# Patient Record
Sex: Female | Born: 1945 | Race: White | Hispanic: No | Smoking: Former smoker
Health system: Southern US, Community
[De-identification: ages and names within clinical notes are randomized; demographics above are authoritative.]

## PROBLEM LIST (undated history)

## (undated) DIAGNOSIS — E119 Type 2 diabetes mellitus without complications: Secondary | ICD-10-CM

## (undated) DIAGNOSIS — I10 Essential (primary) hypertension: Secondary | ICD-10-CM

## (undated) DIAGNOSIS — IMO0002 Reserved for concepts with insufficient information to code with codable children: Secondary | ICD-10-CM

## (undated) DIAGNOSIS — I509 Heart failure, unspecified: Secondary | ICD-10-CM

## (undated) DIAGNOSIS — M797 Fibromyalgia: Secondary | ICD-10-CM

## (undated) DIAGNOSIS — M329 Systemic lupus erythematosus, unspecified: Secondary | ICD-10-CM

## (undated) HISTORY — PX: HERNIA REPAIR: SHX51

## (undated) HISTORY — PX: CHOLECYSTECTOMY: SHX55

## (undated) HISTORY — PX: ABDOMINAL HYSTERECTOMY: SHX81

---

## 2006-11-01 ENCOUNTER — Emergency Department (HOSPITAL_COMMUNITY): Admission: EM | Admit: 2006-11-01 | Discharge: 2006-11-01 | Payer: Self-pay | Admitting: Emergency Medicine

## 2006-11-02 ENCOUNTER — Ambulatory Visit: Payer: Self-pay | Admitting: Vascular Surgery

## 2006-11-02 ENCOUNTER — Encounter (INDEPENDENT_AMBULATORY_CARE_PROVIDER_SITE_OTHER): Payer: Self-pay | Admitting: Sports Medicine

## 2006-11-02 ENCOUNTER — Ambulatory Visit: Admission: RE | Admit: 2006-11-02 | Discharge: 2006-11-02 | Payer: Self-pay | Admitting: Sports Medicine

## 2008-12-29 ENCOUNTER — Encounter: Admission: RE | Admit: 2008-12-29 | Discharge: 2008-12-29 | Payer: Self-pay | Admitting: Family Medicine

## 2009-08-03 ENCOUNTER — Encounter: Admission: RE | Admit: 2009-08-03 | Discharge: 2009-08-03 | Payer: Self-pay | Admitting: Family Medicine

## 2011-03-27 IMAGING — CT CT PELVIS W/ CM
3 of 5 series · 12 of 36 positions shown, 18 images · IV contrast (READICAT/WATER & [ID] OMNI 300)
Comparison: None.

CT ABDOMEN

CLINICAL DATA: Right upper quadrant pain.  Left adrenal lesion.
History of prior abdominal hernia repair, appendectomy, and total
abdominal hysterectomy.

CT ABDOMEN AND PELVIS WITH CONTRAST
TECHNIQUE: Multidetector CT imaging of the abdomen and pelvis was
performed using the standard protocol following bolus
administration of intravenous contrast.
Contrast: 125 ml [id number]

[Series 3: routine abdomen · axial · 0.76mm/px · z∈[-393,-8]mm · 8 of 99 slices shown, 13 images]
[im 11/99  soft-tissue]
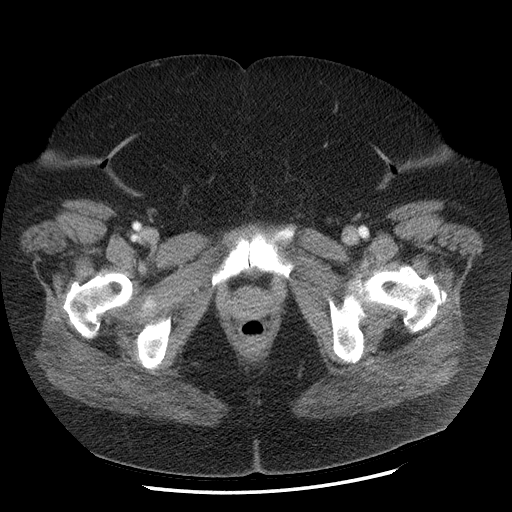
[im 11/99  bone]
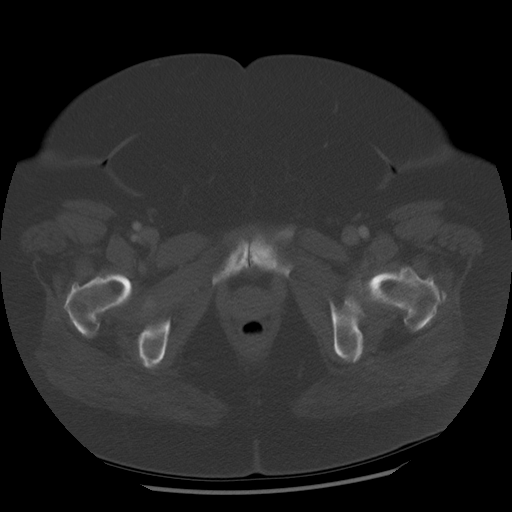
[im 22/99  soft-tissue]
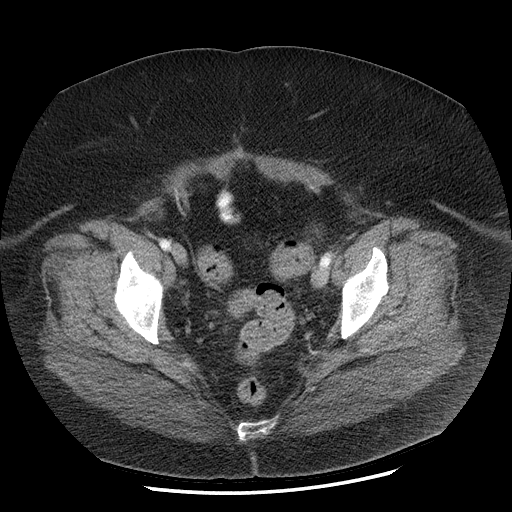
[im 33/99  soft-tissue]
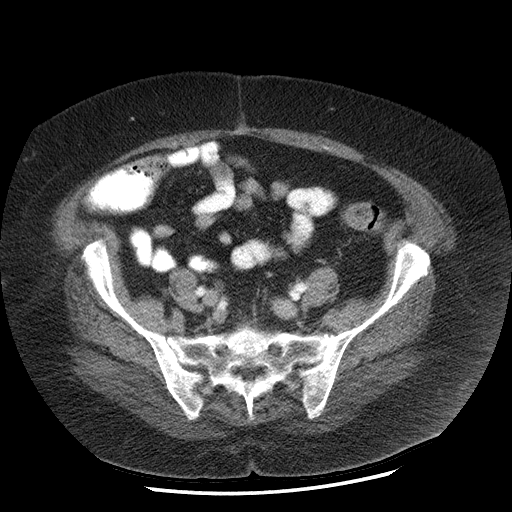
[im 44/99  soft-tissue]
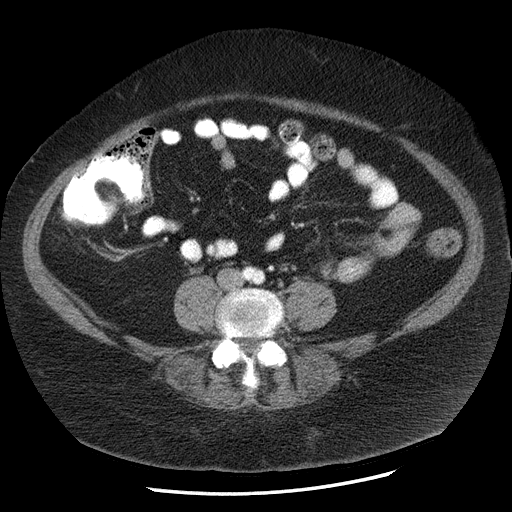
[im 55/99  soft-tissue]
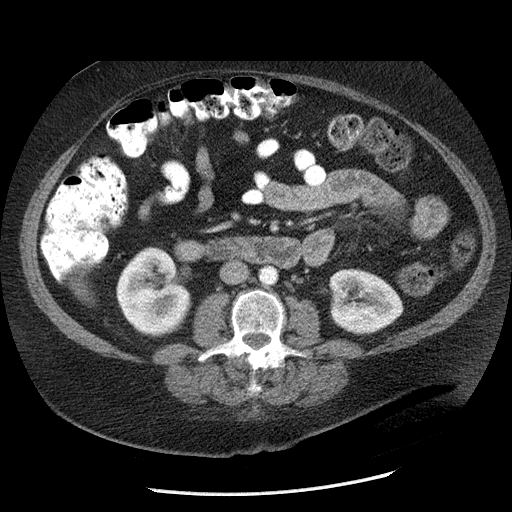
[im 55/99  lung]
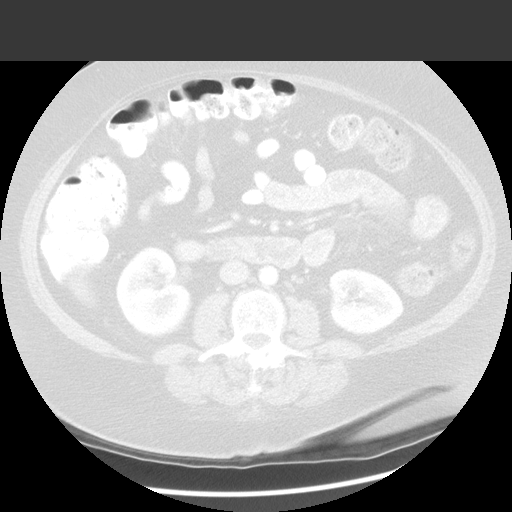
[im 66/99  soft-tissue]
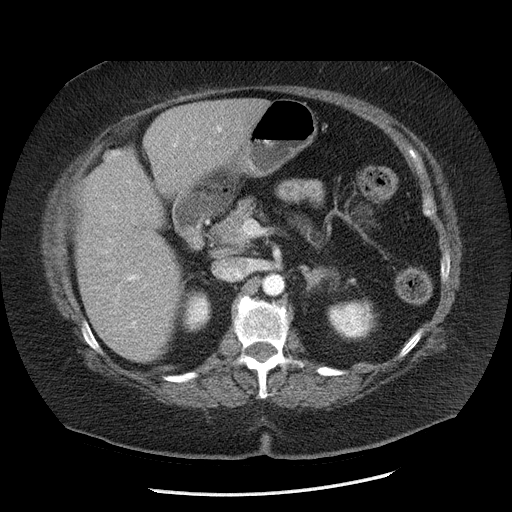
[im 66/99  lung]
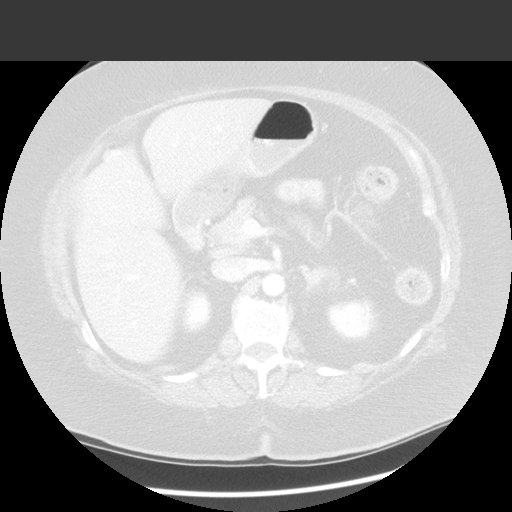
[im 77/99  soft-tissue]
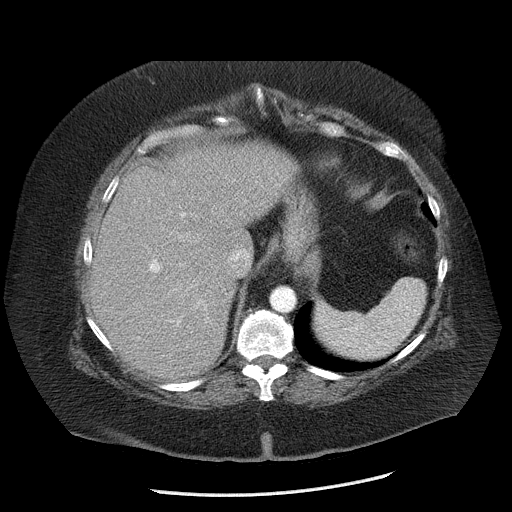
[im 77/99  lung]
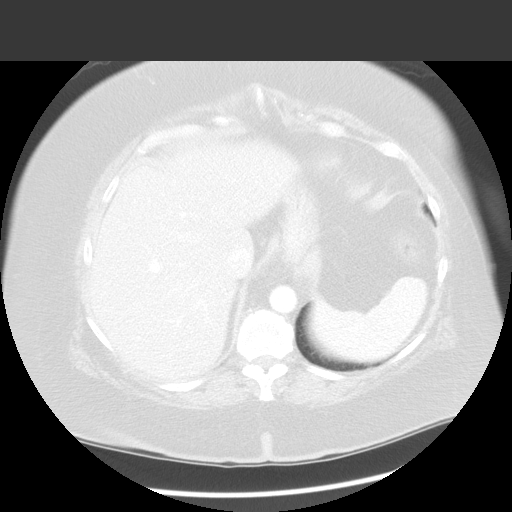
[im 88/99  soft-tissue]
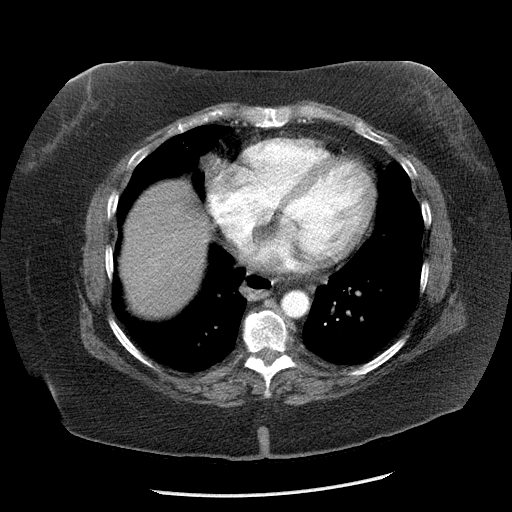
[im 88/99  lung]
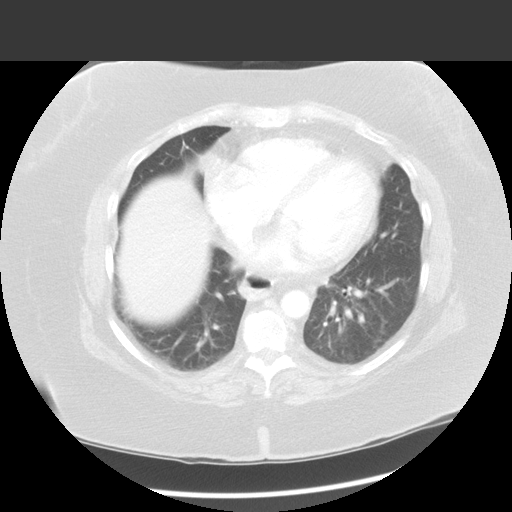

[Series 601: coronal body · coronal · 0.99mm/px · 1 of 144 slices shown, 2 images]
[im 48/144  soft-tissue]
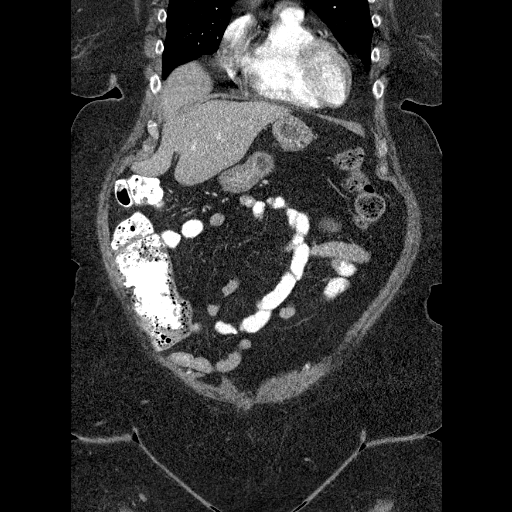
[im 48/144  bone]
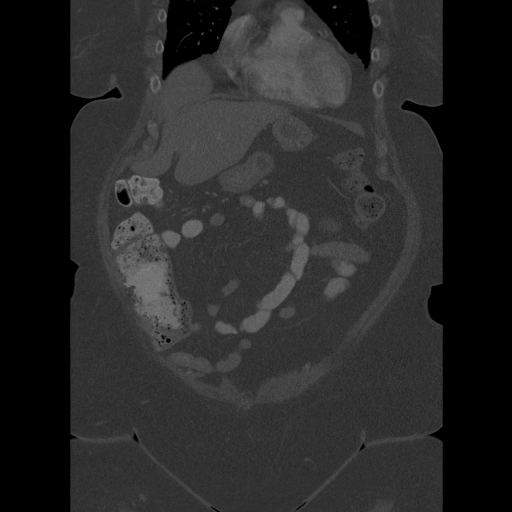

[Series 602: sagittal body · sagittal · 0.99mm/px · 3 of 158 slices shown]
[im 11/158  soft-tissue]
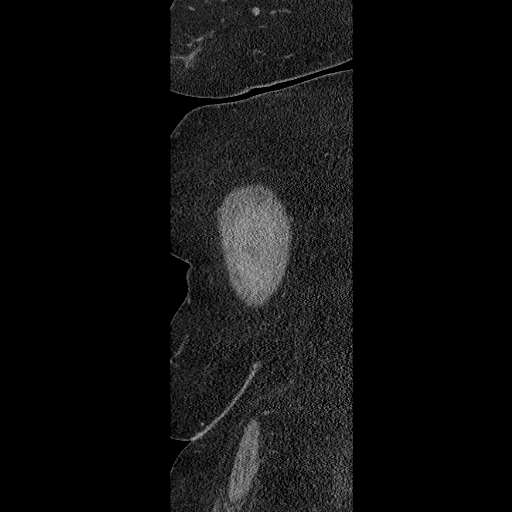
[im 32/158  soft-tissue]
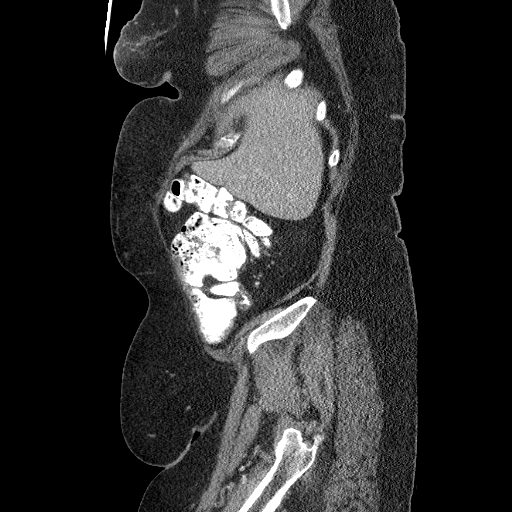
[im 53/158  soft-tissue]
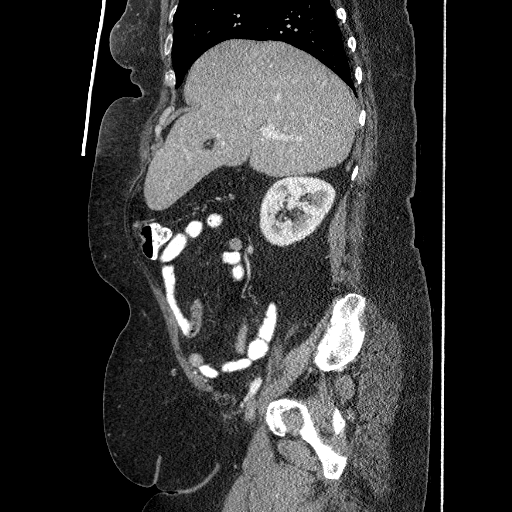

[12 of 36 positions shown; findings below may reference images not displayed]

FINDINGS: The liver, spleen, duodenum, pancreas, and kidneys have
normal imaging features.  Moderate hiatal hernia is evident, but
stomach is otherwise unremarkable.  The gallbladder is surgically
absent.

There is no adrenal mass on either side.  Some portions of the left
adrenal parenchyma have an axial orientation which could mimic a
mass on the axial sequences, but sagittal and coronal reformations
confirm that there is no mass lesion within the left adrenal gland.

There is no abdominal lymphadenopathy.  No abdominal aortic
aneurysm.  The abdominal bowel loops have normal imaging features.
There is no evidence for recurrent anterior abdominal wall hernia.
IMPRESSION: No evidence for left adrenal mass.

No evidence for recurrent anterior abdominal wall hernia.

No acute findings in the abdomen.

Moderate hiatal hernia.

CT PELVIS
FINDINGS: No intraperitoneal free fluid.  No pelvic
lymphadenopathy.  Small lymph nodes are seen in both pelvic
sidewalls, but do not meet CT criteria for pathologic enlargement.
The bladder is unremarkable.  No substantial diverticular change in
the colon and there is no evidence for diverticulitis.

The terminal ileum is normal.  Nonvisualization of the appendix is
consistent with the reported history of prior appendectomy.  The
uterus is surgically absent.  There is no evidence for an adnexal
mass.

Bone windows show sclerotic changes at the symphysis pubis.  No
worrisome lytic or sclerotic osseous lesion is evident.
IMPRESSION: No acute findings in the pelvis.

## 2016-09-24 ENCOUNTER — Emergency Department (HOSPITAL_COMMUNITY): Payer: Medicare PPO

## 2016-09-24 ENCOUNTER — Encounter (HOSPITAL_COMMUNITY): Payer: Self-pay

## 2016-09-24 ENCOUNTER — Emergency Department (HOSPITAL_COMMUNITY)
Admission: EM | Admit: 2016-09-24 | Discharge: 2016-09-25 | Disposition: A | Discharge status: Home / Self Care | Payer: Medicare PPO | Attending: Emergency Medicine | Admitting: Emergency Medicine

## 2016-09-24 DIAGNOSIS — Y939 Activity, unspecified: Secondary | ICD-10-CM | POA: Insufficient documentation

## 2016-09-24 DIAGNOSIS — I110 Hypertensive heart disease with heart failure: Secondary | ICD-10-CM | POA: Insufficient documentation

## 2016-09-24 DIAGNOSIS — W228XXA Striking against or struck by other objects, initial encounter: Secondary | ICD-10-CM | POA: Diagnosis not present

## 2016-09-24 DIAGNOSIS — S0990XA Unspecified injury of head, initial encounter: Secondary | ICD-10-CM | POA: Diagnosis present

## 2016-09-24 DIAGNOSIS — Y92009 Unspecified place in unspecified non-institutional (private) residence as the place of occurrence of the external cause: Secondary | ICD-10-CM | POA: Insufficient documentation

## 2016-09-24 DIAGNOSIS — I509 Heart failure, unspecified: Secondary | ICD-10-CM | POA: Insufficient documentation

## 2016-09-24 DIAGNOSIS — S0101XA Laceration without foreign body of scalp, initial encounter: Secondary | ICD-10-CM | POA: Diagnosis not present

## 2016-09-24 DIAGNOSIS — E119 Type 2 diabetes mellitus without complications: Secondary | ICD-10-CM | POA: Diagnosis not present

## 2016-09-24 DIAGNOSIS — Z87891 Personal history of nicotine dependence: Secondary | ICD-10-CM | POA: Diagnosis not present

## 2016-09-24 DIAGNOSIS — M62838 Other muscle spasm: Secondary | ICD-10-CM | POA: Diagnosis not present

## 2016-09-24 DIAGNOSIS — Y999 Unspecified external cause status: Secondary | ICD-10-CM | POA: Diagnosis not present

## 2016-09-24 DIAGNOSIS — W19XXXA Unspecified fall, initial encounter: Secondary | ICD-10-CM

## 2016-09-24 HISTORY — DX: Fibromyalgia: M79.7

## 2016-09-24 HISTORY — DX: Essential (primary) hypertension: I10

## 2016-09-24 HISTORY — DX: Type 2 diabetes mellitus without complications: E11.9

## 2016-09-24 HISTORY — DX: Reserved for concepts with insufficient information to code with codable children: IMO0002

## 2016-09-24 HISTORY — DX: Heart failure, unspecified: I50.9

## 2016-09-24 HISTORY — DX: Systemic lupus erythematosus, unspecified: M32.9

## 2016-09-24 MED ORDER — ORPHENADRINE CITRATE 30 MG/ML IJ SOLN: 30.0000 mg | Freq: Once | INTRAMUSCULAR | Status: DC

## 2016-09-24 MED ORDER — FENTANYL CITRATE (PF) 100 MCG/2ML IJ SOLN
50.0000 ug | INTRAMUSCULAR | Status: DC | PRN
  Administered 2016-09-24 (×2): 50 ug via INTRAVENOUS
  Filled 2016-09-24 (×2): qty 2

## 2016-09-24 NOTE — ED Provider Notes (Signed)
[facility]-EMERGENCY DEPT Provider Note   CSN: [id number] Arrival date & time: 09/24/16  2116     History   Chief Complaint Chief Complaint  Patient presents with  . Fall  . Head Injury  . Head Laceration  . Back Pain    HPI Charlene Carter is a 71 y.o. female.  Patient reports having a mechanical fall. Attempted walking indoors. Fell backwards. Struck her head on the ground. No loss of consciousness. No blood thinners. Reporting pain from her head all the way down her spine. No other complaints.   The history is provided by the patient.  Illness  This is a new problem. The current episode started less than 1 hour ago. The problem occurs constantly. The problem has not changed since onset.Associated symptoms include headaches. Pertinent negatives include no chest pain, no abdominal pain and no shortness of breath. She has tried nothing for the symptoms.    Past Medical History:  Diagnosis Date  . CHF (congestive heart failure) (HCC)   . Diabetes mellitus without complication (HCC)   . Fibromyalgia   . Hypertension   . Lupus     There are no active problems to display for this patient.   Past Surgical History:  Procedure Laterality Date  . ABDOMINAL HYSTERECTOMY    . CHOLECYSTECTOMY    . HERNIA REPAIR      OB History    No data available       Home Medications    Prior to Admission medications   Medication Sig Start Date End Date Taking? Authorizing Provider  cyclobenzaprine (FLEXERIL) 10 MG tablet Take 1 tablet (10 mg total) by mouth 3 (three) times daily as needed for muscle spasms. 09/25/16   Lindalou Hose, MD    Family History History reviewed. No pertinent family history.  Social History Social History  Substance Use Topics  . Smoking status: Former Games developer  . Smokeless tobacco: Never Used  . Alcohol use No     Allergies   Lipitor [atorvastatin]; Lyrica [pregabalin]; and Vioxx [rofecoxib]   Review of Systems Review of Systems  Eyes: Negative for  visual disturbance.  Respiratory: Negative for shortness of breath.   Cardiovascular: Negative for chest pain.  Gastrointestinal: Negative for abdominal pain, nausea and vomiting.  Musculoskeletal: Positive for back pain and neck pain.  Skin: Positive for wound.  Neurological: Positive for headaches.       No LOC     Physical Exam Updated Vital Signs BP (!) 155/75   Pulse 84   Temp 98.1 F (36.7 C) (Oral)   Resp 20   SpO2 99%   Physical Exam  Constitutional: She appears well-developed and well-nourished. No distress.  HENT:  Head: Normocephalic and atraumatic.    Eyes: Conjunctivae are normal.  Neck: Neck supple.  Cardiovascular: Normal rate and regular rhythm.   No murmur heard. Pulmonary/Chest: Effort normal and breath sounds normal. No respiratory distress.  No tenderness to palpation of the chest wall with AP or lateral compression. No crepitus.  Abdominal: Soft. There is no tenderness.  No tenderness to palpation of abdomen. No peritoneal signs.  Musculoskeletal: She exhibits no edema.  Tenderness to palpation along the entire midline spine. No step-offs.  Neurological: She is alert.  Skin: Skin is warm and dry.  Psychiatric: She has a normal mood and affect.  Nursing note and vitals reviewed.    ED Treatments / Results  Labs (all labs ordered are listed, but only abnormal results are displayed) Labs Reviewed - No  data to display  EKG  EKG Interpretation None       Radiology Ct Head Wo Contrast  Result Date: 09/24/2016 CLINICAL DATA:  Patient fell striking the back of the head. Laceration to the back of the head. No loss of consciousness. EXAM: CT HEAD WITHOUT CONTRAST CT CERVICAL SPINE WITHOUT CONTRAST TECHNIQUE: Multidetector CT imaging of the head and cervical spine was performed following the standard protocol without intravenous contrast. Multiplanar CT image reconstructions of the cervical spine were also generated. COMPARISON:  None. FINDINGS: CT  HEAD FINDINGS Brain: No evidence of acute infarction, hemorrhage, hydrocephalus, extra-axial collection or mass lesion/mass effect. Vascular: No hyperdense vessel or unexpected calcification. Skull: Normal. Negative for fracture or focal lesion. Sinuses/Orbits: No acute finding. Other: Small subcutaneous scalp hematoma posteriorly. CT CERVICAL SPINE FINDINGS Alignment: Normal. Skull base and vertebrae: No acute fracture. No primary bone lesion or focal pathologic process. Soft tissues and spinal canal: No prevertebral fluid or swelling. No visible canal hematoma. Disc levels: Degenerative changes in the cervical spine. Mild hypertrophic changes at the endplates with mild disc space narrowing at C5-6, C6-7, and C7-T1. More prominent degenerative changes in the facet joints. Upper chest: Motion artifact limits evaluation of the lung apices. Suggestion of esophageal dilatation although this is incompletely included in field of view. Other: None. IMPRESSION: No acute intracranial abnormalities. Normal alignment of the cervical spine. No acute displaced fractures identified. Degenerative changes. Electronically Signed   By: Burman NievesWilliam  Stevens M.D.   On: 09/24/2016 23:44   Ct Cervical Spine Wo Contrast  Result Date: 09/24/2016 CLINICAL DATA:  Patient fell striking the back of the head. Laceration to the back of the head. No loss of consciousness. EXAM: CT HEAD WITHOUT CONTRAST CT CERVICAL SPINE WITHOUT CONTRAST TECHNIQUE: Multidetector CT imaging of the head and cervical spine was performed following the standard protocol without intravenous contrast. Multiplanar CT image reconstructions of the cervical spine were also generated. COMPARISON:  None. FINDINGS: CT HEAD FINDINGS Brain: No evidence of acute infarction, hemorrhage, hydrocephalus, extra-axial collection or mass lesion/mass effect. Vascular: No hyperdense vessel or unexpected calcification. Skull: Normal. Negative for fracture or focal lesion. Sinuses/Orbits:  No acute finding. Other: Small subcutaneous scalp hematoma posteriorly. CT CERVICAL SPINE FINDINGS Alignment: Normal. Skull base and vertebrae: No acute fracture. No primary bone lesion or focal pathologic process. Soft tissues and spinal canal: No prevertebral fluid or swelling. No visible canal hematoma. Disc levels: Degenerative changes in the cervical spine. Mild hypertrophic changes at the endplates with mild disc space narrowing at C5-6, C6-7, and C7-T1. More prominent degenerative changes in the facet joints. Upper chest: Motion artifact limits evaluation of the lung apices. Suggestion of esophageal dilatation although this is incompletely included in field of view. Other: None. IMPRESSION: No acute intracranial abnormalities. Normal alignment of the cervical spine. No acute displaced fractures identified. Degenerative changes. Electronically Signed   By: Burman NievesWilliam  Stevens M.D.   On: 09/24/2016 23:44   Ct Thoracic Spine Wo Contrast  Result Date: 09/24/2016 CLINICAL DATA:  71 y/o  F; status post fall, initial encounter. EXAM: CT THORACIC SPINE WITHOUT CONTRAST TECHNIQUE: Multidetector CT images of the thoracic were obtained using the standard protocol without intravenous contrast. COMPARISON:  None. FINDINGS: Alignment: Normal. Vertebrae: No acute fracture or focal pathologic process. Paraspinal and other soft tissues: Moderate size hiatal hernia with herniation of proximal stomach into the mediastinum. Massively dilated esophagus with fluid level. Mild calcific atherosclerosis of the aorta. Minor dependent atelectasis in the visualized lungs. Disc levels: Moderate disc  and mild facet degenerative changes of the thoracic spine. IMPRESSION: 1. No acute fracture or dislocation of the thoracic spine. 2. Moderate disc and mild facet degenerative changes of the thoracic spine. 3. Moderate hiatal hernia. Massively dilated esophagus with fluid level, evaluation for obstruction or achalasia of the gastroesophageal  junction is recommended. Electronically Signed   By: Mitzi Hansen M.D.   On: 09/24/2016 23:45   Ct Lumbar Spine Wo Contrast  Result Date: 09/24/2016 CLINICAL DATA:  71 y/o  F; status post fall, initial encounter. EXAM: CT LUMBAR SPINE WITHOUT CONTRAST TECHNIQUE: Multidetector CT imaging of the lumbar spine was performed without intravenous contrast administration. Multiplanar CT image reconstructions were also generated. COMPARISON:  Concurrent thoracic spine CT. FINDINGS: Segmentation: 5 lumbar type vertebrae. Alignment: Normal. Vertebrae: No acute fracture or focal pathologic process. Paraspinal and other soft tissues: Moderate hiatal hernia and esophageal dilatation. Soft tissue stranding is present within posterior subcutaneous fat in the lumbar region which may represent soft tissue contusion. Aortic atherosclerosis with moderate calcification. Disc levels: Mild discogenic degenerative changes of the lumbar spine greatest at L5-S1 where there is mild disc space narrowing and vacuum phenomenon. Advanced L4-5 and L5-S1 facet arthrosis. No high-grade bony canal stenosis. IMPRESSION: 1. No acute fracture or dislocation of the lumbar spine. 2. Lumbar spondylosis with prominent lower lumbar facet arthrosis. No high-grade bony canal stenosis. 3. Moderate hiatal hernia and esophagus dilatation better characterized on thoracic spine CT. 4. Soft tissue stranding present within posterior subcutaneous fat of the lumbar region may represent soft tissue contusion. 5. Calcific aortic atherosclerosis. Electronically Signed   By: Mitzi Hansen M.D.   On: 09/24/2016 23:49   Dg Chest Portable 1 View  Result Date: 09/24/2016 CLINICAL DATA:  Patient turned his foot and fell backwards, striking head and back on concrete. History of hypertension and CHF. EXAM: PORTABLE CHEST 1 VIEW COMPARISON:  08/03/2009 FINDINGS: Shallow inspiration. Heart size and pulmonary vascularity are slightly prominent but  likely normal for technique. There is atelectasis in the lung bases. No focal consolidation. No blunting of costophrenic angles. No pneumothorax. Tortuous aorta. IMPRESSION: Shallow inspiration with linear atelectasis in the lung bases. No evidence of active pulmonary disease. Electronically Signed   By: Burman Nieves M.D.   On: 09/24/2016 22:24    Procedures Procedures (including critical care time)  Medications Ordered in ED Medications  fentaNYL (SUBLIMAZE) injection 50 mcg (50 mcg Intravenous Given 09/24/16 2358)  fentaNYL (SUBLIMAZE) injection 50 mcg (not administered)  cyclobenzaprine (FLEXERIL) tablet 10 mg (10 mg Oral Given 09/25/16 0019)     Initial Impression / Assessment and Plan / ED Course  I have reviewed the triage vital signs and the nursing notes.  Pertinent labs & imaging results that were available during my care of the patient were reviewed by me and considered in my medical decision making (see chart for details).     Patient's fall was mechanical in nature. Denies any prodromal symptoms such as chest pain, shortness of breath, palpitations, headache, vision changes that occurred prior to her fall. Did not lose consciousness. She has a small laceration in the posterior head that is hemostatic. Tetanus is up-to-date. No evidence of palpable underlying skull fracture. Nothing that would require staples at this point. just x-ray obtained without evidence of pneumothorax or rib fractures. CT of the patient's head and spine obtained. No evidence of intracranial bleed. No evidence of skull fracture. No evidence of any acute fractures of the entire spine. Patient has no other acute traumatic  findings on history or physical exam. Do not feel patient requires any other advanced imaging.   Gave the patient pain medications here. She does report having a history of chronic pain medication [place]. Encouraged her to continue to take her at home pain medications, and gave her a  prescription for muscle relaxant. Warned her about the possibility of developing a concussion. Warned her about the possibility of muscle spasm and worsening pain over the next 24 hours. Told her to follow up with her primary care doctor as soon as possible. Patient ambulatory in the emergency department prior to discharge.  Final Clinical Impressions(s) / ED Diagnoses   Final diagnoses:  Fall, initial encounter  Laceration of scalp, initial encounter  Muscle spasm    New Prescriptions New Prescriptions   CYCLOBENZAPRINE (FLEXERIL) 10 MG TABLET    Take 1 tablet (10 mg total) by mouth 3 (three) times daily as needed for muscle spasms.     Lindalou Hose, MD 09/25/16 1610    Gerhard Munch, MD 09/26/16 [phone]

## 2016-09-24 NOTE — ED Triage Notes (Signed)
To room via EMS.  Pt was going into house, turned foot, fell backwards hitting head and back on concrete.  Lac to back of head, bleeding controlled.   Towel roll around neck.  No LOC.  A&Ox4.

## 2016-09-24 NOTE — ED Notes (Signed)
Pt. Transported to CT 

## 2016-09-24 NOTE — ED Notes (Signed)
ED Provider at bedside. 

## 2016-09-25 MED ORDER — ORPHENADRINE CITRATE ER 100 MG PO TB12
100.0000 mg | ORAL_TABLET | Freq: Once | ORAL | Status: DC
  Filled 2016-09-25: qty 1

## 2016-09-25 MED ORDER — FENTANYL CITRATE (PF) 100 MCG/2ML IJ SOLN: 50.0000 ug | Freq: Once | INTRAMUSCULAR | Status: DC

## 2016-09-25 MED ORDER — CYCLOBENZAPRINE HCL 10 MG PO TABS: 10.0000 mg | ORAL_TABLET | Freq: Three times a day (TID) | ORAL | 0 refills | Status: AC | PRN

## 2016-09-25 MED ORDER — CYCLOBENZAPRINE HCL 10 MG PO TABS
10.0000 mg | ORAL_TABLET | Freq: Once | ORAL | Status: AC
  Administered 2016-09-25: 10 mg via ORAL
  Filled 2016-09-25: qty 1

## 2018-12-21 IMAGING — CR DG CHEST 1V PORT
1 series · 1 of 1 positions shown · non-contrast
Comparison: 08/03/2009

CLINICAL DATA: Patient turned his foot and fell backwards, striking
head and back on concrete. History of hypertension and CHF.

EXAM:
PORTABLE CHEST 1 VIEW

[AP]
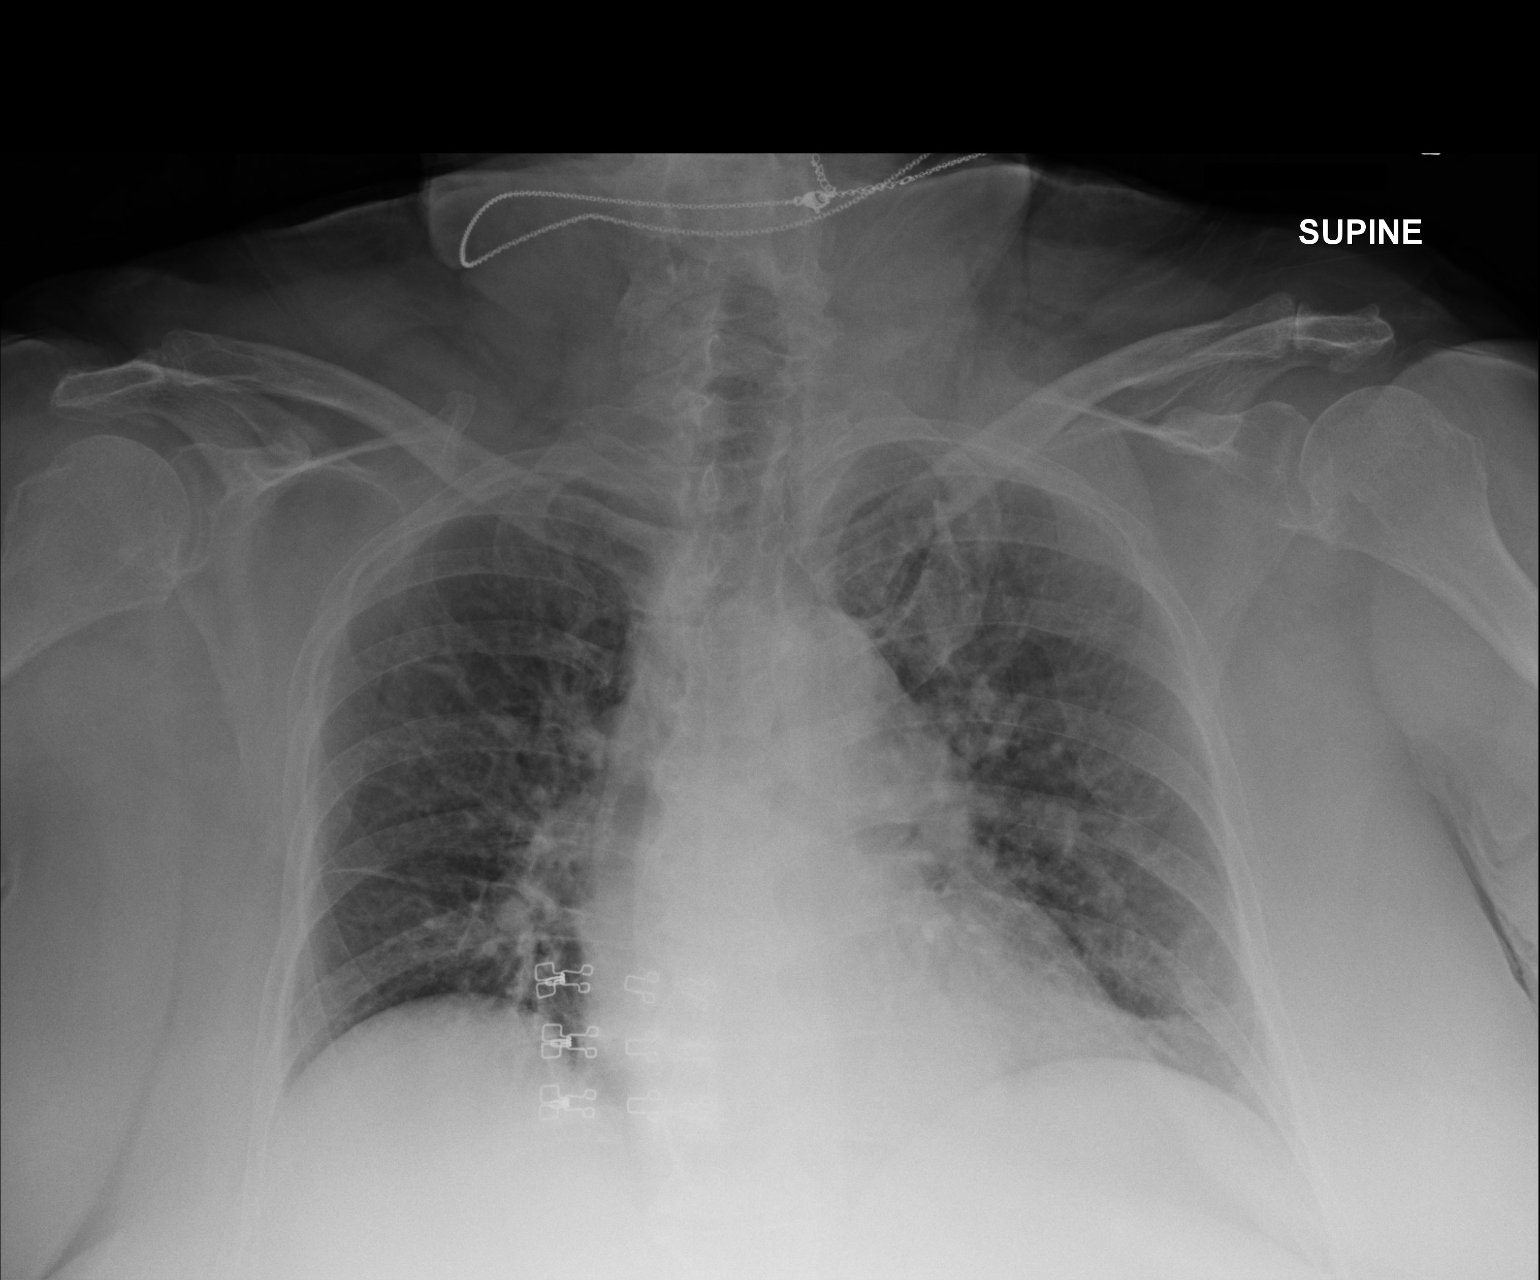

[1 of 1 positions shown; findings below may reference images not displayed]

FINDINGS: Shallow inspiration. Heart size and pulmonary vascularity are
slightly prominent but likely normal for technique. There is
atelectasis in the lung bases. No focal consolidation. No blunting
of costophrenic angles. No pneumothorax. Tortuous aorta.
IMPRESSION: Shallow inspiration with linear atelectasis in the lung bases. No
evidence of active pulmonary disease.

## 2018-12-21 IMAGING — CT CT L SPINE W/O CM
3 series · 10 of 33 positions shown, 12 images · non-contrast
Comparison: Concurrent thoracic spine CT.

CLINICAL DATA: 70 y/o  F; status post fall, initial encounter.

EXAM:
CT LUMBAR SPINE WITHOUT CONTRAST
TECHNIQUE: Multidetector CT imaging of the lumbar spine was performed without
intravenous contrast administration. Multiplanar CT image
reconstructions were also generated.

[Series 4: l-spine 2.0 st · axial · 0.31mm/px · z∈[+522,+662]mm · 2 of 151 slices shown, 3 images]
[im 47/151  soft-tissue]
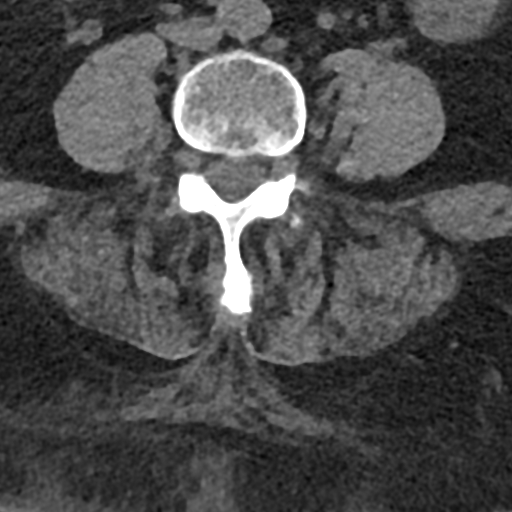
[im 47/151  bone]
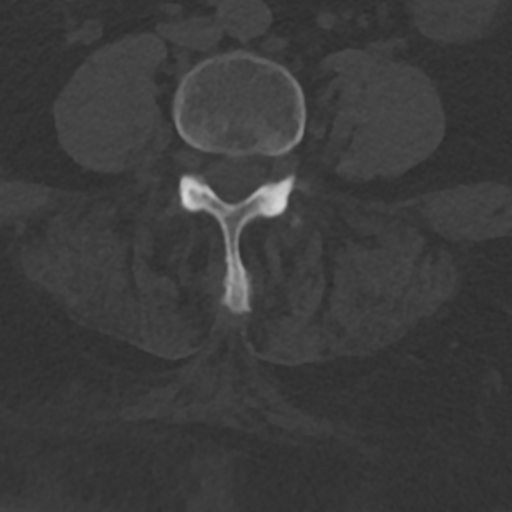
[im 116/151  bone]
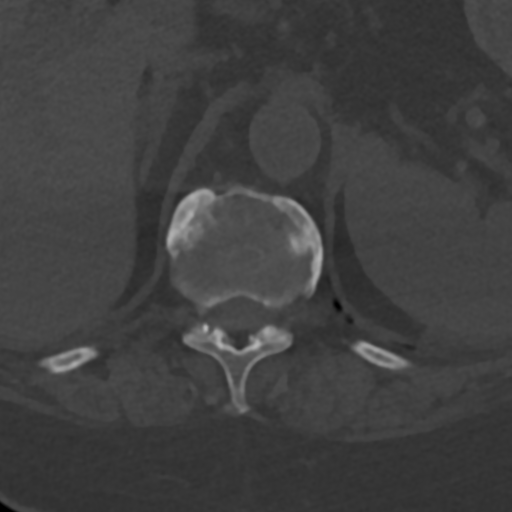

[Series 6: l-spine 2.0 cor bone · coronal · 0.33mm/px · 3 of 67 slices shown]
[im 14/67  bone]
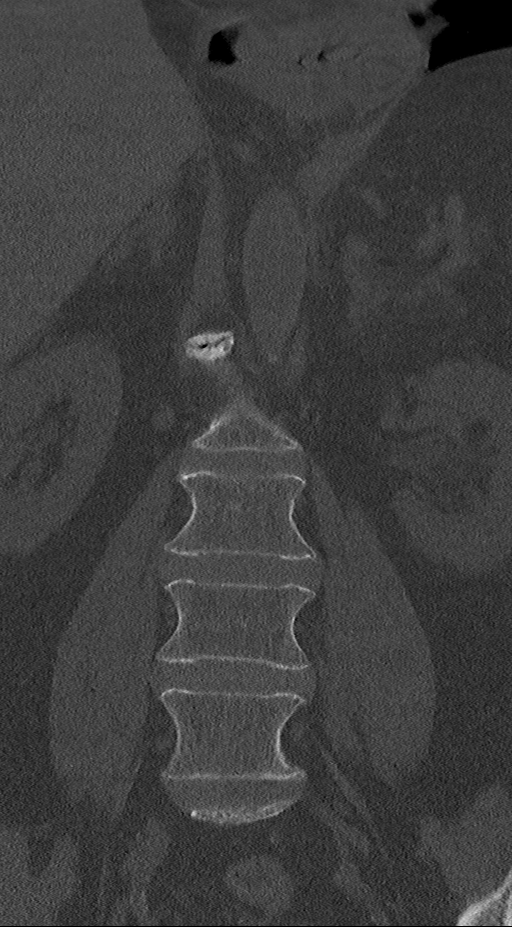
[im 27/67  bone]
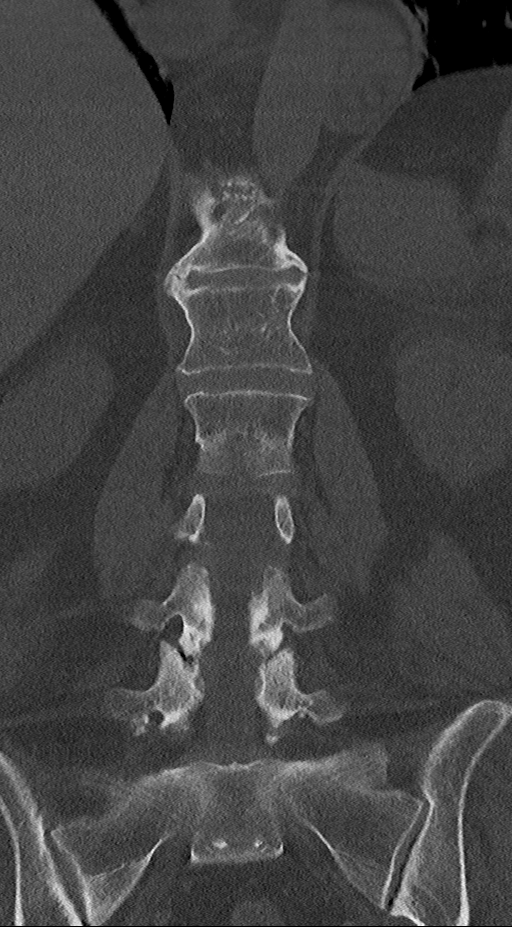
[im 40/67  bone]
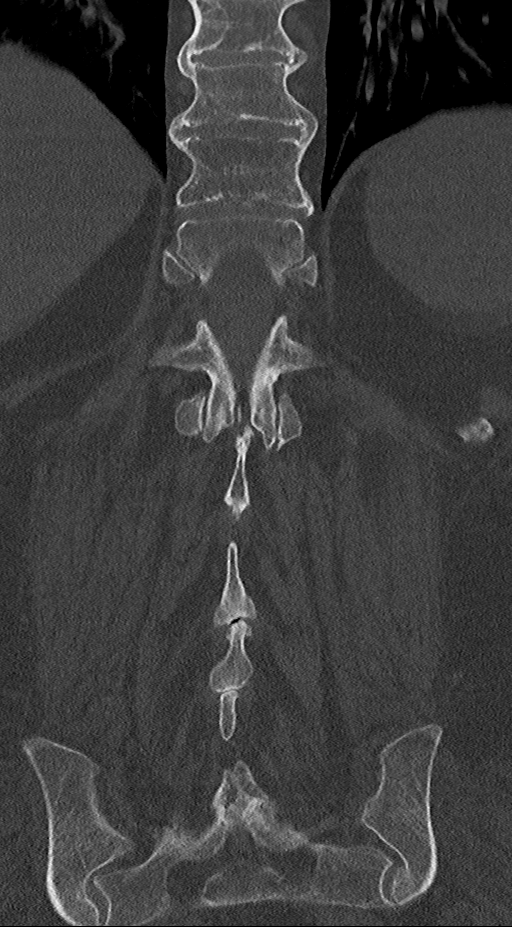

[Series 7: l-spine 2.0 sag bone · sagittal · 0.32mm/px · 5 of 71 slices shown, 6 images]
[im 24/71  bone]
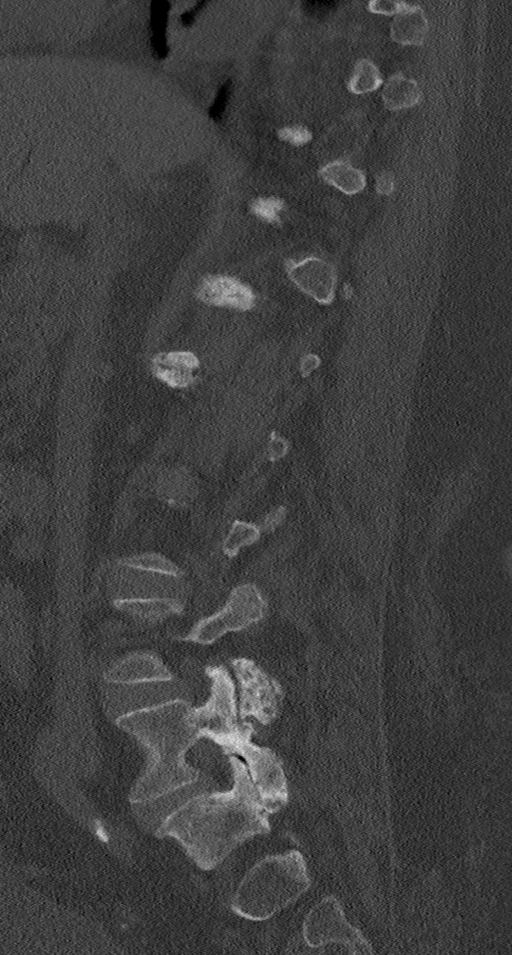
[im 30/71  bone]
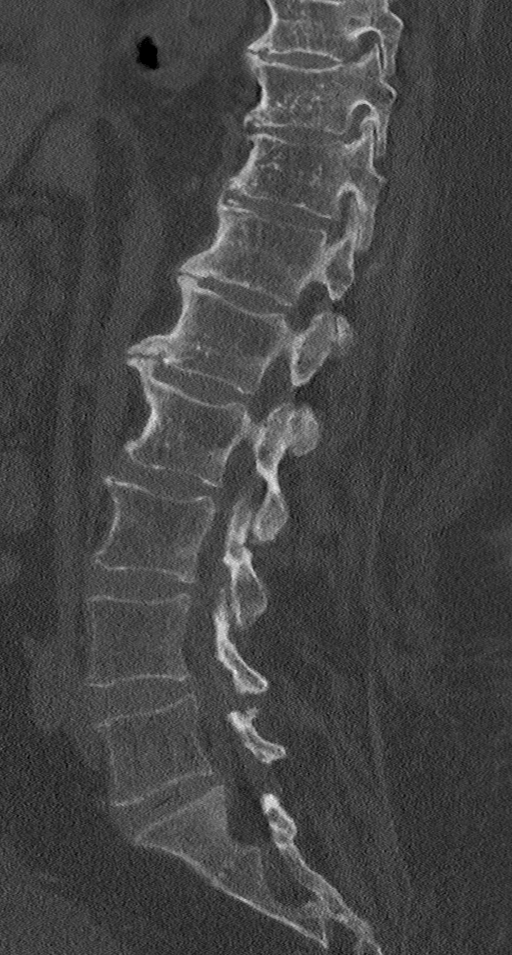
[im 36/71  soft-tissue]
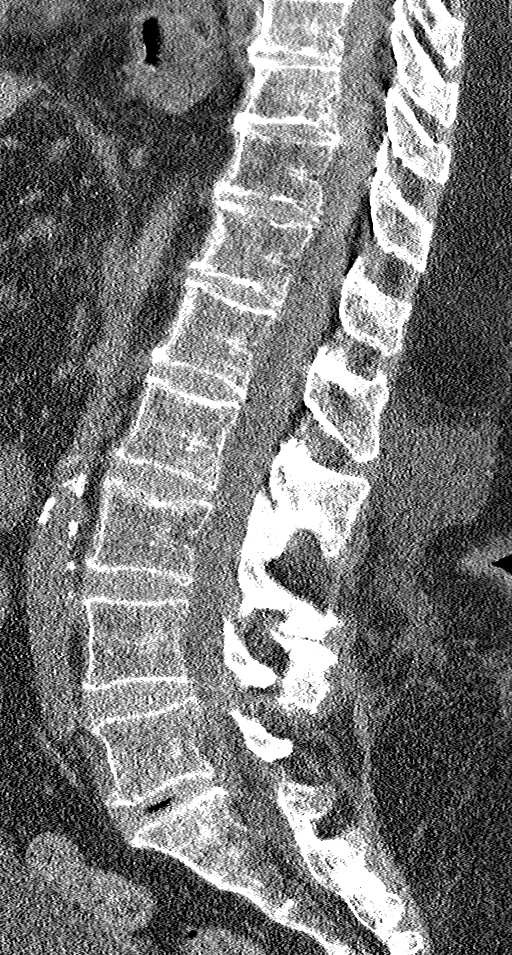
[im 36/71  bone]
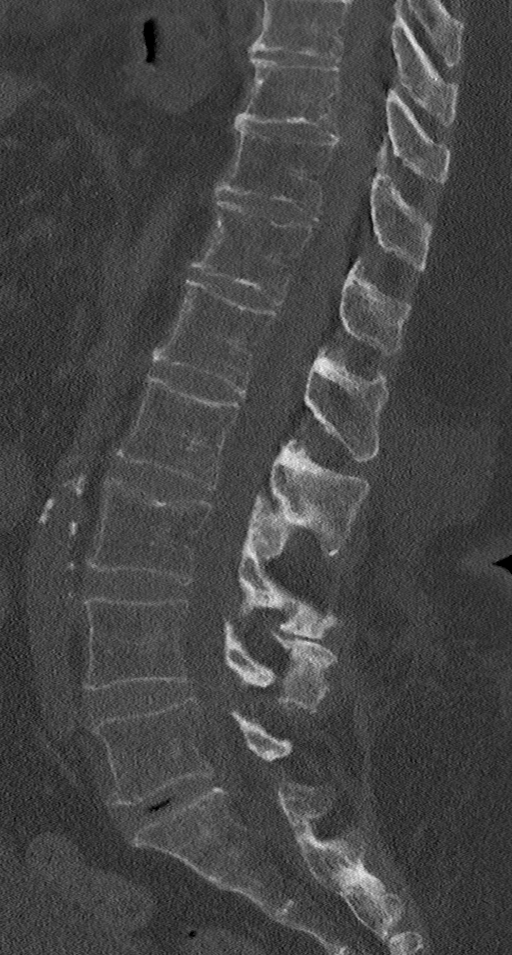
[im 41/71  bone]
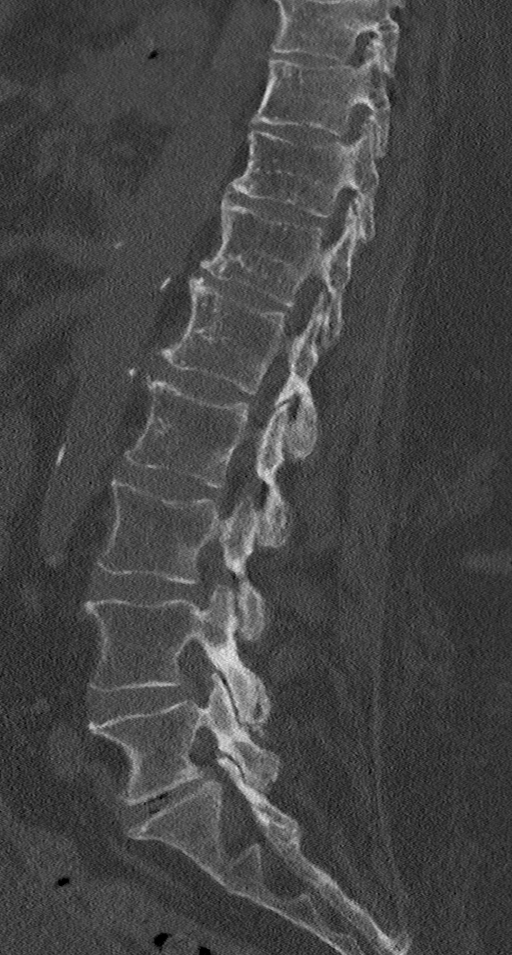
[im 47/71  bone]
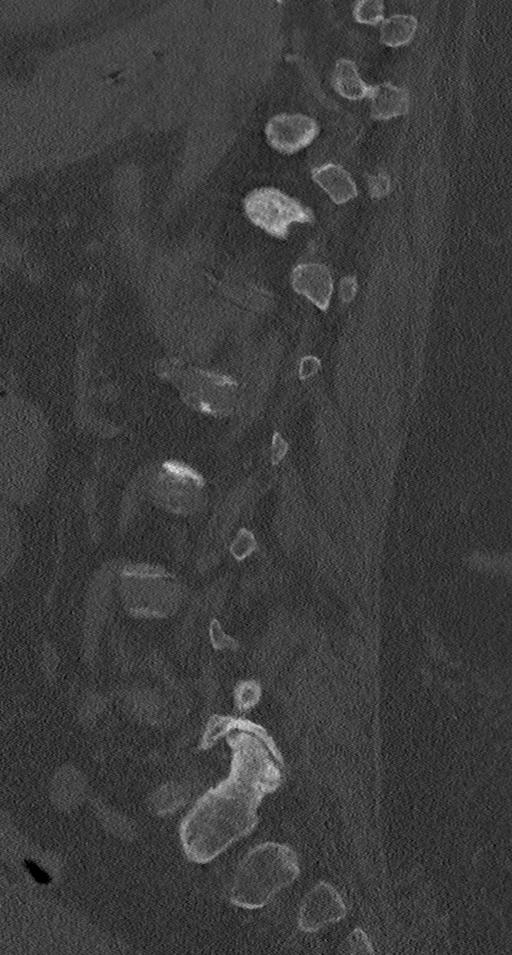

[10 of 33 positions shown; findings below may reference images not displayed]

FINDINGS: Segmentation: 5 lumbar type vertebrae.

Alignment: Normal.

Vertebrae: No acute fracture or focal pathologic process.

Paraspinal and other soft tissues: Moderate hiatal hernia and
esophageal dilatation. Soft tissue stranding is present within
posterior subcutaneous fat in the lumbar region which may represent
soft tissue contusion. Aortic atherosclerosis with moderate
calcification.

Disc levels: Mild discogenic degenerative changes of the lumbar
spine greatest at L5-S1 where there is mild disc space narrowing and
vacuum phenomenon. Advanced L4-5 and L5-S1 facet arthrosis. No
high-grade bony canal stenosis.
IMPRESSION: 1. No acute fracture or dislocation of the lumbar spine.
2. Lumbar spondylosis with prominent lower lumbar facet arthrosis.
No high-grade bony canal stenosis.
3. Moderate hiatal hernia and esophagus dilatation better
characterized on thoracic spine CT.
4. Soft tissue stranding present within posterior subcutaneous fat
of the lumbar region may represent soft tissue contusion.
5. Calcific aortic atherosclerosis.

By: Felner Ceola M.D.
